# Patient Record
Sex: Male | Born: 1995 | Race: White | Hispanic: No | Marital: Single | Smoking: Never smoker
Health system: Southern US, Community
[De-identification: ages and names within clinical notes are randomized; demographics above are authoritative.]

## PROBLEM LIST (undated history)

## (undated) DIAGNOSIS — F429 Obsessive-compulsive disorder, unspecified: Secondary | ICD-10-CM

## (undated) DIAGNOSIS — Z8739 Personal history of other diseases of the musculoskeletal system and connective tissue: Secondary | ICD-10-CM

## (undated) DIAGNOSIS — E663 Overweight: Secondary | ICD-10-CM

## (undated) DIAGNOSIS — T7840XA Allergy, unspecified, initial encounter: Secondary | ICD-10-CM

## (undated) DIAGNOSIS — F419 Anxiety disorder, unspecified: Secondary | ICD-10-CM

## (undated) HISTORY — DX: Allergy, unspecified, initial encounter: T78.40XA

## (undated) HISTORY — DX: Overweight: E66.3

## (undated) HISTORY — DX: Anxiety disorder, unspecified: F41.9

## (undated) HISTORY — DX: Personal history of other diseases of the musculoskeletal system and connective tissue: Z87.39

## (undated) HISTORY — DX: Obsessive-compulsive disorder, unspecified: F42.9

---
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
## 2016-01-09 ENCOUNTER — Encounter: Payer: Self-pay | Admitting: Family Medicine

## 2016-01-09 ENCOUNTER — Ambulatory Visit (INDEPENDENT_AMBULATORY_CARE_PROVIDER_SITE_OTHER): Payer: Medicaid Other | Admitting: Family Medicine

## 2016-01-09 VITALS — BP 99/65 | HR 99 | Temp 98.5°F | Ht 70.0 in | Wt 144.8 lb

## 2016-01-09 DIAGNOSIS — Z0000 Encounter for general adult medical examination without abnormal findings: Secondary | ICD-10-CM

## 2016-01-09 DIAGNOSIS — Z79899 Other long term (current) drug therapy: Secondary | ICD-10-CM | POA: Diagnosis not present

## 2016-01-09 DIAGNOSIS — Z1159 Encounter for screening for other viral diseases: Secondary | ICD-10-CM

## 2016-01-09 LAB — CBC WITH DIFFERENTIAL/PLATELET
BASOS PCT: 0 %
Basophils Absolute: 0 cells/uL (ref 0–200)
EOS ABS: 136 {cells}/uL (ref 15–500)
EOS PCT: 2 %
HCT: 41.8 % (ref 36.0–49.0)
HEMOGLOBIN: 14.2 g/dL (ref 12.0–16.9)
LYMPHS ABS: 2516 {cells}/uL (ref 1200–5200)
Lymphocytes Relative: 37 %
MCH: 29.6 pg (ref 25.0–35.0)
MCHC: 34 g/dL (ref 31.0–36.0)
MCV: 87.3 fL (ref 78.0–98.0)
MONOS PCT: 8 %
MPV: 9.7 fL (ref 7.5–12.5)
Monocytes Absolute: 544 cells/uL (ref 200–900)
NEUTROS ABS: 3604 {cells}/uL (ref 1800–8000)
Neutrophils Relative %: 53 %
PLATELETS: 321 10*3/uL (ref 140–400)
RBC: 4.79 MIL/uL (ref 4.10–5.70)
RDW: 13.1 % (ref 11.0–15.0)
WBC: 6.8 10*3/uL (ref 4.5–13.0)

## 2016-01-09 MED ORDER — LORATADINE 10 MG PO TABS: 10.0000 mg | ORAL_TABLET | Freq: Every day | ORAL | 11 refills | Status: DC

## 2016-01-09 NOTE — Progress Notes (Signed)
Subjective: CC: annual physical exam  HPI: Patient is a 20 y.o. male with a past medical history of bipolar disorder presenting to clinic today for an annual exam.  No current issues. He has a Rx from Dr. Jannifer FranklinAkintayo who requests testing with CBC with diff, CMET, Lipid panel, vitamin D, vitamin B12, and folate levels. His mood and sleep are stable. His ADHD is doing well on Vyvanse. Appetite is good.  He walks a lot of college for exercise.   Without his brother and grandmother in the room (for which he resides) we discuss confidentiality.  Social history: He denies alcohol, drug, or tobacco use. He denies any sexual activity. He denies peer pressure or bullying. He feels safe in his home. There are no guns in the home.  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    Health Maintenance: he last saw the dentist a few months ago and is going back to have a cavity filled. He is due to HIV screening.   ROS: All other systems reviewed and are negative besides that noted in HPI.  Past Medical History Patient Active Problem List   Diagnosis Date Noted  . Healthcare maintenance 01/14/2016  . BMI (body mass index), pediatric, 5% to less than 85% for age 19/18/2015  . Seasonal allergies 09/02/2010  . 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 CONSTIPATION, CHRONIC 01/02/2010  . ACQUIRED AUDITORY PROCESSING DISORDER 11/15/2008  . OBESITY 12/14/2007  . PES PLANUS 11/16/2007  . BIPOLAR DISORDER 07/21/2006    Medications- reviewed and updated Current Outpatient Prescriptions  Medication Sig Dispense Refill  . lisdexamfetamine (VYVANSE) 60 MG capsule Take 60 mg by mouth every morning.    Marland Kitchen. buPROPion (WELLBUTRIN XL) 300 MG 24 hr tablet Take 300 mg by mouth every morning. Per Dr. Ladona Ridgelaylor    . cloNIDine (CATAPRES) 0.1 MG tablet Take 0.1 mg by mouth at bedtime. Per Dr. Ladona Ridgelaylor     . fluvoxaMINE (LUVOX) 100 MG tablet Take 200 mg by mouth at bedtime.     . folic acid (FOLVITE) 1 MG tablet TAKE 1  TABLET (1 MG TOTAL) BY MOUTH DAILY. 30 tablet 1  . loratadine (CLARITIN) 10 MG tablet Take 1 tablet (10 mg total) by mouth daily. 30 tablet 11  . Nutritional Supplements (MELATONIN PO) Unknown dose     . Psyllium (METAMUCIL) 30.9 % POWD Take by mouth as needed.      . Vitamin D, Cholecalciferol, 1000 UNITS TABS Take two tablets by mouth, daily. 60 tablet 3   No current facility-administered medications for this visit.     Objective: Office vital signs reviewed. BP 99/65   Pulse 99   Temp 98.5 F (36.9 C) (Oral)   Ht 5\' 10"  (1.778 m)   Wt 144 lb 12.8 oz (65.7 kg)   BMI 20.78 kg/m  Blood pressure percentiles are 1.5 % systolic and 21.1 % diastolic based on NHBPEP's 4th Report.     Physical Examination:  General: Awake, alert, well- nourished, NAD. Thin.  ENMT:  TMs intact, normal light reflex, no erythema, no bulging. Nasal turbinates moist. MMM, Oropharynx clear without erythema or tonsillar exudate/hypertrophy Eyes: Conjunctiva non-injected. PERRL.  Cardio: RRR, no m/r/g noted.  Pulm: No increased WOB.  CTAB, without wheezes, rhonchi or crackles noted.  GI: soft, NT/ND,+BS x4, no hepatomegaly, no splenomegaly GU: deffered Extremities: WWP, No edema, cyanosis or clubbing; +2 pulses bilaterally MSK: Normal gait and station Skin: dry, intact, no rashes or lesions Neuro: Strength and sensation grossly  intact, DTRs 2/4  Assessment/Plan: Healthcare maintenance Patient's vaccines are UTD.  Pt reportedly has never been sexually active, however will check HIV status.  Will fax results to Dr. Jannifer FranklinAkintayo at [phone].  Discussed annual eye exam, healthy food choices, and regular exercise.  Patient to f/u in 1-2 months for annual influenza vaccine then as needed after that until annual exam in 12/2016.     Orders Placed This Encounter  Procedures  . CBC with Differential/Platelet  . COMPLETE METABOLIC PANEL WITH GFR  . Lipid panel  . VITAMIN D 25 Hydroxy (Vit-D Deficiency,  Fractures)  . HIV antibody  . Vitamin B12  . Folate    Meds ordered this encounter  Medications  . lisdexamfetamine (VYVANSE) 60 MG capsule    Sig: Take 60 mg by mouth every morning.  . loratadine (CLARITIN) 10 MG tablet    Sig: Take 1 tablet (10 mg total) by mouth daily.    Dispense:  30 tablet    Refill:  11    Nadav Swindell Derek MoundS. Nastacia Raybuck PGY-3, [facility]

## 2016-01-09 NOTE — Patient Instructions (Signed)
Thank you for coming in, today!  Everything looks fine, today. I did not change any medications. Clifton Custardaron looks great and should follow up as needed.  I do recommend that he comes back to see us at least once per year for a wellness visit. I will fax all the results to Dr. Jannifer FranklinAkintayo.   Please feel free to call with any questions or concerns at any time, at [phone].

## 2016-01-10 LAB — LIPID PANEL
Cholesterol: 135 mg/dL (ref 125–170)
HDL: 46 mg/dL (ref 31–65)
LDL CALC: 78 mg/dL (ref <110)
Total CHOL/HDL Ratio: 2.9 Ratio (ref <=5.0)
Triglycerides: 57 mg/dL (ref 38–152)
VLDL: 11 mg/dL (ref <30)

## 2016-01-10 LAB — COMPLETE METABOLIC PANEL WITH GFR
ALBUMIN: 4.3 g/dL (ref 3.6–5.1)
ALT: 9 U/L (ref 8–46)
AST: 12 U/L (ref 12–32)
Alkaline Phosphatase: 87 U/L (ref 48–230)
BILIRUBIN TOTAL: 0.4 mg/dL (ref 0.2–1.1)
BUN: 10 mg/dL (ref 7–20)
CO2: 25 mmol/L (ref 20–31)
CREATININE: 1.04 mg/dL (ref 0.60–1.26)
Calcium: 9.7 mg/dL (ref 8.9–10.4)
Chloride: 103 mmol/L (ref 98–110)
GFR, Est Non African American: >89 mL/min (ref >=60)
GLUCOSE: 82 mg/dL (ref 65–99)
Potassium: 4 mmol/L (ref 3.8–5.1)
SODIUM: 141 mmol/L (ref 135–146)
TOTAL PROTEIN: 7.1 g/dL (ref 6.3–8.2)

## 2016-01-10 LAB — FOLATE: FOLATE: 18.8 ng/mL (ref >5.4)

## 2016-01-10 LAB — VITAMIN D 25 HYDROXY (VIT D DEFICIENCY, FRACTURES): VIT D 25 HYDROXY: 66 ng/mL (ref 30–100)

## 2016-01-10 LAB — VITAMIN B12

## 2016-01-10 LAB — HIV ANTIBODY (ROUTINE TESTING W REFLEX): HIV: NONREACTIVE

## 2016-01-14 DIAGNOSIS — Z0000 Encounter for general adult medical examination without abnormal findings: Secondary | ICD-10-CM | POA: Insufficient documentation

## 2016-01-14 NOTE — Assessment & Plan Note (Signed)
Patient's vaccines are UTD.  Pt reportedly has never been sexually active, however will check HIV status.  Will fax results to Dr. Jannifer FranklinAkintayo at [phone].  Discussed annual eye exam, healthy food choices, and regular exercise.  Patient to f/u in 1-2 months for annual influenza vaccine then as needed after that until annual exam in 12/2016.

## 2016-02-07 ENCOUNTER — Other Ambulatory Visit: Payer: Self-pay | Admitting: Family Medicine

## 2016-04-10 ENCOUNTER — Other Ambulatory Visit: Payer: Self-pay | Admitting: Family Medicine

## 2016-06-14 ENCOUNTER — Other Ambulatory Visit: Payer: Self-pay | Admitting: Family Medicine

## 2016-08-09 ENCOUNTER — Other Ambulatory Visit: Payer: Self-pay | Admitting: Family Medicine

## 2016-10-03 ENCOUNTER — Other Ambulatory Visit: Payer: Self-pay | Admitting: Family Medicine

## 2016-12-13 ENCOUNTER — Other Ambulatory Visit: Payer: Self-pay | Admitting: *Deleted

## 2016-12-14 MED ORDER — FOLIC ACID 1 MG PO TABS: 1.0000 mg | ORAL_TABLET | Freq: Every day | ORAL | 1 refills | Status: DC

## 2017-01-07 ENCOUNTER — Encounter: Payer: Self-pay | Admitting: Family Medicine

## 2017-01-07 ENCOUNTER — Ambulatory Visit (INDEPENDENT_AMBULATORY_CARE_PROVIDER_SITE_OTHER): Payer: Medicaid Other | Admitting: Family Medicine

## 2017-01-07 VITALS — BP 108/64 | HR 103 | Temp 98.4°F | Ht 70.5 in | Wt 141.0 lb

## 2017-01-07 DIAGNOSIS — Z0000 Encounter for general adult medical examination without abnormal findings: Secondary | ICD-10-CM

## 2017-01-07 DIAGNOSIS — Z23 Encounter for immunization: Secondary | ICD-10-CM | POA: Diagnosis not present

## 2017-01-07 NOTE — Patient Instructions (Signed)
Today we talked about health maintenance topics. You received your tetanus vaccine today in clinic. We will draw a cbc, cmp, and b12 today. WE will call you with these results.

## 2017-01-08 LAB — CMP AND LIVER
ALBUMIN: 4.9 g/dL (ref 3.5–5.5)
ALT: 15 IU/L (ref 0–44)
AST: 19 IU/L (ref 0–40)
Alkaline Phosphatase: 99 IU/L (ref 39–117)
BUN: 13 mg/dL (ref 6–20)
Bilirubin Total: 0.3 mg/dL (ref 0.0–1.2)
Bilirubin, Direct: 0.09 mg/dL (ref 0.00–0.40)
CALCIUM: 10.3 mg/dL — AB (ref 8.7–10.2)
CHLORIDE: 99 mmol/L (ref 96–106)
CO2: 26 mmol/L (ref 20–29)
CREATININE: 1.06 mg/dL (ref 0.76–1.27)
GFR, EST AFRICAN AMERICAN: 117 mL/min/{1.73_m2} (ref >59)
GFR, EST NON AFRICAN AMERICAN: 101 mL/min/{1.73_m2} (ref >59)
Glucose: 93 mg/dL (ref 65–99)
Potassium: 4.6 mmol/L (ref 3.5–5.2)
SODIUM: 142 mmol/L (ref 134–144)
TOTAL PROTEIN: 7.5 g/dL (ref 6.0–8.5)

## 2017-01-08 LAB — CBC WITH DIFFERENTIAL/PLATELET
Basophils Absolute: 0 10*3/uL (ref 0.0–0.2)
Basos: 0 %
EOS (ABSOLUTE): 0.1 10*3/uL (ref 0.0–0.4)
EOS: 1 %
Hematocrit: 43.7 % (ref 37.5–51.0)
Hemoglobin: 15 g/dL (ref 13.0–17.7)
IMMATURE GRANS (ABS): 0 10*3/uL (ref 0.0–0.1)
IMMATURE GRANULOCYTES: 0 %
LYMPHS: 25 %
Lymphocytes Absolute: 2 10*3/uL (ref 0.7–3.1)
MCH: 30.6 pg (ref 26.6–33.0)
MCHC: 34.3 g/dL (ref 31.5–35.7)
MCV: 89 fL (ref 79–97)
MONOS ABS: 0.7 10*3/uL (ref 0.1–0.9)
Monocytes: 9 %
NEUTROS PCT: 65 %
Neutrophils Absolute: 5.2 10*3/uL (ref 1.4–7.0)
PLATELETS: 339 10*3/uL (ref 150–379)
RBC: 4.9 x10E6/uL (ref 4.14–5.80)
RDW: 13.2 % (ref 12.3–15.4)
WBC: 8 10*3/uL (ref 3.4–10.8)

## 2017-01-08 LAB — VITAMIN B12: Vitamin B-12: >2000 pg/mL — ABNORMAL HIGH (ref 232–1245)

## 2017-01-17 NOTE — Assessment & Plan Note (Signed)
Patient received TDaP today as this was due Discussed healthy food choice, regular exercise, and encouraged yearly eye exam and dental checkup twice per year Will check cbc, cmp, and b12 due to psychiatric medications Patient to follow up in 1 year for annual physical

## 2017-01-17 NOTE — Progress Notes (Signed)
Subjective:     Justin Barber is a 21 y.o. male and is here for a comprehensive physical exam. The patient reports no problems. He is doing very well with no social, physical, or mental complaints. He says that he dos not get much exercise, with that being limited to occasional walking probably 1-2 times per week. He tries to eat a well balanced diet often getting 1-2 servings of vegetables per day. He occasionally does eat what could be considered junk food and does consume some sugary drinks. He is currently looking for a job. He enjoys reading, movies, and watching tv. He is not sexually active, and has never consumed alcohol, tobacco, or other drugs.  Social History   Social History  . Marital status: Single    Spouse name: N/A  . Number of children: N/A  . Years of education: N/A   Occupational History  . Not on file.   Social History Main Topics  . Smoking status: Never Smoker  . Smokeless tobacco: Never Used  . Alcohol use No  . Drug use: No  . Sexual activity: No   Other Topics Concern  . Not on file   Social History Narrative  . No narrative on file   Health Maintenance  Topic Date Due  . INFLUENZA VACCINE  12/22/2016  . TETANUS/TDAP  01/08/2027  . HIV Screening  Completed    The following portions of the patient's history were reviewed and updated as appropriate: allergies, current medications, past family history, past medical history, past social history, past surgical history and problem list.  Review of Systems Review of Systems  Constitutional: Negative for chills and fever.  HENT: Negative for hearing loss and tinnitus.   Eyes: Negative for pain, discharge and redness.  Respiratory: Negative for cough.   Cardiovascular: Negative for chest pain, palpitations, orthopnea and claudication.  Gastrointestinal: Negative for abdominal pain, constipation, diarrhea, nausea and vomiting.  Genitourinary: Negative for dysuria, flank pain, hematuria and urgency.   Musculoskeletal: Negative for myalgias.  Neurological: Negative for dizziness, weakness and headaches.  Psychiatric/Behavioral: Negative for suicidal ideas.     Objective:    General appearance: alert, cooperative and appears stated age Head: Normocephalic, without obvious abnormality, atraumatic Eyes: conjunctivae/corneas clear. PERRL, EOM's intact. Fundi benign. Ears: normal TM's and external ear canals both ears Nose: Nares normal. Septum midline. Mucosa normal. No drainage or sinus tenderness. Throat: lips, mucosa, and tongue normal; teeth and gums normal Neck: no adenopathy, no carotid bruit, no JVD, supple, symmetrical, trachea midline and thyroid not enlarged, symmetric, no tenderness/mass/nodules Lungs: clear to auscultation bilaterally and normal percussion bilaterally Chest wall: no tenderness Heart: regular rate and rhythm, S1, S2 normal, no murmur, click, rub or gallop and normal apical impulse Abdomen: soft, non-tender; bowel sounds normal; no masses,  no organomegaly Extremities: extremities normal, atraumatic, no cyanosis or edema Pulses: 2+ and symmetric Skin: Skin color, texture, turgor normal. No rashes or lesions Neurologic: Alert and oriented X 3, normal strength and tone. Normal symmetric reflexes. Normal coordination and gait AOx3, cn 2-12 intact   Assessment and plan  Healthcare Maintenence Patient received TDaP today as this was due Discussed healthy food choice, regular exercise, and encouraged yearly eye exam and dental checkup twice per year Will check cbc, cmp, and b12 due to psychiatric medications Patient to follow up in 1 year for annual physical     Myrene Buddy MD PGY-1 Family Medicine Resident

## 2017-02-06 ENCOUNTER — Other Ambulatory Visit: Payer: Self-pay | Admitting: Family Medicine

## 2017-05-13 ENCOUNTER — Other Ambulatory Visit: Payer: Self-pay | Admitting: *Deleted

## 2017-05-13 MED ORDER — FOLIC ACID 1 MG PO TABS: 1.0000 mg | ORAL_TABLET | Freq: Every day | ORAL | 1 refills | Status: DC

## 2017-05-13 NOTE — Telephone Encounter (Signed)
Patient's grandmother left message on nurse line requesting refill on folic acid. States she has called repeatedly about this and [facility] has been sending faxed requests as well. Please call grandmother at [phone] when this has been refilled. Kinnie FeilL. Riddhi Grether, RN, BSN

## 2017-05-13 NOTE — Telephone Encounter (Signed)
[facility] [place]. Please call patient grandmother and let her know this has been sent in.  Myrene BuddyJacob Damichael Hofman MD PGY-1 Family Medicine Resident

## 2017-06-02 NOTE — Telephone Encounter (Signed)
Attempted to call pt and grandmother, no answer or voicemail set up. Deseree Bruna PotterBlount, CMA

## 2017-07-11 ENCOUNTER — Encounter: Payer: Self-pay | Admitting: Student

## 2017-07-11 ENCOUNTER — Ambulatory Visit (INDEPENDENT_AMBULATORY_CARE_PROVIDER_SITE_OTHER): Payer: Medicaid Other | Admitting: Student

## 2017-07-11 ENCOUNTER — Other Ambulatory Visit: Payer: Self-pay

## 2017-07-11 VITALS — BP 108/60 | HR 111 | Temp 98.3°F | Ht 70.5 in | Wt 154.0 lb

## 2017-07-11 DIAGNOSIS — R05 Cough: Secondary | ICD-10-CM | POA: Diagnosis present

## 2017-07-11 DIAGNOSIS — R000 Tachycardia, unspecified: Secondary | ICD-10-CM | POA: Diagnosis not present

## 2017-07-11 DIAGNOSIS — R059 Cough, unspecified: Secondary | ICD-10-CM

## 2017-07-11 NOTE — Progress Notes (Signed)
  Subjective:    Justin Barber is a 22 y.o. old male here for cough.  He is here with his grandfather  HPI Cough: this has been going on for two weeks. Cold symptoms two weeks ago. Cough is productive with yellowish phlegm. Denies hemoptysis. Had fever when he had a cold but not any more. Denies shortness of breath, chest pain, history seasonal allergy, asthma, GERD. Denies itchy eyes or nose. Cough is about the same. Cough is mainly at night. Denies GI symptoms.  Had his flu vaccine this year. Tried mucinex and another cough medicine without significant improvement.   PMH/Problem List: has OBESITY; Bipolar I disorder (HCC); ACQUIRED AUDITORY PROCESSING DISORDER; CONSTIPATION, CHRONIC; ACNE VULGARIS; PES PLANUS; Seasonal allergies; BMI (body mass index), pediatric, 5% to less than 85% for age; and Healthcare maintenance on their problem list.   has a past medical history of Allergy, Anxiety, OCD (obsessive compulsive disorder), and Over weight.  FH:  Family History  Problem Relation Age of Onset  . Depression Mother   . Diabetes Mother     [facility] Social History   Tobacco Use  . Smoking status: Never Smoker  . Smokeless tobacco: Never Used  Substance Use Topics  . Alcohol use: No  . Drug use: No    Review of Systems Review of systems negative except for pertinent positives and negatives in history of present illness above.     Objective:     Vitals:   07/11/17 1123  BP: 108/60  Pulse: (!) 111  Temp: 98.3 F (36.8 C)  TempSrc: Oral  SpO2: 98%  Weight: 154 lb (69.9 kg)  Height: 5' 10.5" (1.791 m)   Body mass index is 21.78 kg/m.  Physical Exam  GEN: appears well, no apparent distress. Eyes: conjunctiva without injection, sclera anicteric Nares: no rhinorrhea, congestion or erythema Oropharynx: mmm without erythema or exudation, no petechiae, uvula midline HEM: negative for cervical or periauricular lymphadenopathies CVS: Tachycardic to 102 on my count, RR, nl s1 & s2, no  murmurs, no edema RESP: no IWOB, good air movement bilaterally, CTAB GI: BS present & normal, soft, NTND MSK: no focal tenderness or notable swelling SKIN: no apparent skin lesion ENDO: negative thyromegally NEURO: alert and oiented appropriately, no gross deficits     Assessment and Plan:  1. Cough: likely residual from his viral URI.  Denies allergies or GERD.  No history of asthma.  No constitutional symptoms. -Discussed conservative management including a tablespoonful of honey before bedtime and adequate hydration. -Discussed return precautions. -Patient and grandfather understand that cough might linger around for up to 5 weeks.   2. Tachycardia: Initially tachycardic to 111.  Heart rate down to 102 on my count.  He is asymptomatic.  Denies palpitation.  He has history of ADHD and he is on Vyvanse which could be contributing to his tachycardia.  He had a normal TSH about 3 years ago.  Does not look anemic.  Return if symptoms worsen or fail to improve.  Almon Herculesaye T Gonfa, MD 07/11/17 Pager: [phone]

## 2017-07-11 NOTE — Patient Instructions (Signed)
It appears that you have a viral upper respiratory infection (Common Cold).  Cold symptoms typically peak at 3-4 days of illness and then gradually improve over 10-14 days. However, a cough may last 3-5 weeks.   - A tablespoonful of honey before bedtime is helpful for cough - Get plenty of rest and adequate hydration. - Consume warm fluids (soup or tea). It relieves stuffy nose, and to loosen phlegm. - Can try saline nasal spray or a Neti Pot for stuffy nose  CONTACT YOUR DOCTOR IF YOU EXPERIENCE ANY OF THE FOLLOWING: - High fever, chest pain, shortness of breath or  not able to keep down food or fluids.  - Cough that gets worse while other cold symptoms improve - Flare up of any chronic lung problem, such as asthma - Your symptoms persist longer than 2 weeks  

## 2017-07-20 ENCOUNTER — Other Ambulatory Visit: Payer: Self-pay | Admitting: Family Medicine

## 2017-09-18 ENCOUNTER — Other Ambulatory Visit: Payer: Self-pay | Admitting: Family Medicine

## 2017-09-27 ENCOUNTER — Ambulatory Visit (INDEPENDENT_AMBULATORY_CARE_PROVIDER_SITE_OTHER): Payer: Medicaid Other

## 2017-09-27 DIAGNOSIS — Z111 Encounter for screening for respiratory tuberculosis: Secondary | ICD-10-CM

## 2017-09-27 NOTE — Progress Notes (Signed)
Tuberculin skin test applied to left ventral forearm.  Patient informed to schedule appt for nurse visit in 48-72 hours to have site read.  Meredith B Thomsen, RN   

## 2017-09-30 ENCOUNTER — Ambulatory Visit: Payer: Medicaid Other

## 2017-09-30 DIAGNOSIS — Z111 Encounter for screening for respiratory tuberculosis: Secondary | ICD-10-CM

## 2017-09-30 LAB — TB SKIN TEST: TB Skin Test: NEGATIVE

## 2017-09-30 NOTE — Progress Notes (Signed)
PPD Reading Note PPD read and results entered in Epic. Result: 0 mm induration. Interpretation: Negative Elizette Shek B Ethylene Reznick, RN   

## 2017-11-19 ENCOUNTER — Other Ambulatory Visit: Payer: Self-pay | Admitting: Family Medicine

## 2018-01-17 ENCOUNTER — Other Ambulatory Visit: Payer: Self-pay | Admitting: Family Medicine

## 2018-01-20 ENCOUNTER — Ambulatory Visit (INDEPENDENT_AMBULATORY_CARE_PROVIDER_SITE_OTHER): Payer: Medicaid Other | Admitting: Family Medicine

## 2018-01-20 ENCOUNTER — Other Ambulatory Visit: Payer: Self-pay

## 2018-01-20 ENCOUNTER — Encounter: Payer: Self-pay | Admitting: Family Medicine

## 2018-01-20 VITALS — BP 98/62 | HR 114 | Temp 98.6°F | Ht 70.5 in | Wt 148.0 lb

## 2018-01-20 DIAGNOSIS — Z0000 Encounter for general adult medical examination without abnormal findings: Secondary | ICD-10-CM

## 2018-01-20 DIAGNOSIS — Z23 Encounter for immunization: Secondary | ICD-10-CM

## 2018-01-20 MED ORDER — FOLIC ACID 1 MG PO TABS: 1.0000 mg | ORAL_TABLET | Freq: Every day | ORAL | 3 refills | Status: DC

## 2018-01-20 NOTE — Patient Instructions (Signed)
I am glad that things have been going very well this year.  I am sorry to hear that you have to have retinal surgery soon, I hope that it goes well please let me know if there is any way I can be of assistance.  I gave you a year supply of your folic acid.  We will be drawing some labs today to make sure that your gastric medications are not causing metabolic problem.

## 2018-01-21 LAB — COMPREHENSIVE METABOLIC PANEL
A/G RATIO: 1.7 (ref 1.2–2.2)
ALT: 13 IU/L (ref 0–44)
AST: 17 IU/L (ref 0–40)
Albumin: 4.6 g/dL (ref 3.5–5.5)
Alkaline Phosphatase: 100 IU/L (ref 39–117)
BUN/Creatinine Ratio: 9 (ref 9–20)
BUN: 11 mg/dL (ref 6–20)
Bilirubin Total: 0.2 mg/dL (ref 0.0–1.2)
CALCIUM: 10.3 mg/dL — AB (ref 8.7–10.2)
CHLORIDE: 101 mmol/L (ref 96–106)
CO2: 28 mmol/L (ref 20–29)
CREATININE: 1.16 mg/dL (ref 0.76–1.27)
GFR, EST AFRICAN AMERICAN: 104 mL/min/{1.73_m2} (ref >59)
GFR, EST NON AFRICAN AMERICAN: 90 mL/min/{1.73_m2} (ref >59)
Globulin, Total: 2.7 g/dL (ref 1.5–4.5)
Glucose: 93 mg/dL (ref 65–99)
POTASSIUM: 4.2 mmol/L (ref 3.5–5.2)
Sodium: 144 mmol/L (ref 134–144)
TOTAL PROTEIN: 7.3 g/dL (ref 6.0–8.5)

## 2018-01-21 LAB — CBC WITH DIFFERENTIAL/PLATELET
BASOS: 0 %
Basophils Absolute: 0 10*3/uL (ref 0.0–0.2)
EOS (ABSOLUTE): 0 10*3/uL (ref 0.0–0.4)
EOS: 1 %
Hematocrit: 44.8 % (ref 37.5–51.0)
Hemoglobin: 15.4 g/dL (ref 13.0–17.7)
IMMATURE GRANS (ABS): 0 10*3/uL (ref 0.0–0.1)
IMMATURE GRANULOCYTES: 0 %
LYMPHS: 23 %
Lymphocytes Absolute: 1.3 10*3/uL (ref 0.7–3.1)
MCH: 30.2 pg (ref 26.6–33.0)
MCHC: 34.4 g/dL (ref 31.5–35.7)
MCV: 88 fL (ref 79–97)
Monocytes Absolute: 0.8 10*3/uL (ref 0.1–0.9)
Monocytes: 14 %
NEUTROS PCT: 62 %
Neutrophils Absolute: 3.4 10*3/uL (ref 1.4–7.0)
Platelets: 291 10*3/uL (ref 150–450)
RBC: 5.1 x10E6/uL (ref 4.14–5.80)
RDW: 11.8 % — ABNORMAL LOW (ref 12.3–15.4)
WBC: 5.5 10*3/uL (ref 3.4–10.8)

## 2018-01-21 LAB — VITAMIN B12: Vitamin B-12: 641 pg/mL (ref 232–1245)

## 2018-01-24 ENCOUNTER — Encounter: Payer: Self-pay | Admitting: Family Medicine

## 2018-01-24 NOTE — Progress Notes (Signed)
   HPI 22 year old who presents for annual wellness visit.  Had no issues over the last year.  Only seen once in clinic for cough.  See psychiatry for bipolar 1 and adhd.  Does not have any medication adjustments this year tolerating clonidine and Wellbutrin well with no side effects.  Also tolerating vyvanse well with no side effects.  CC: annual wellness   ROS:   Review of Systems See HPI for ROS.   CC, SH/smoking status, and VS noted  Objective: BP 98/62   Pulse (!) 114   Temp 98.6 F (37 C) (Oral)   Ht 5' 10.5" (1.791 m)   Wt 148 lb (67.1 kg)   SpO2 99%   BMI 20.94 kg/m  General appearance: alert, cooperative and appears stated age Head: Normocephalic, without obvious abnormality, atraumatic Eyes: conjunctivae/corneas clear. PERRL, EOM's intact. Fundi benign. Ears: normal TM's and external ear canals both ears Nose: Nares normal. Septum midline. Mucosa normal. No drainage or sinus tenderness. Throat: lips, mucosa, and tongue normal; teeth and gums normal Neck: no adenopathy, no carotid bruit, no JVD, supple, symmetrical, trachea midline and thyroid not enlarged, symmetric, no tenderness/mass/nodules Lungs: CTAB, normal work of breathing Heart: rrr, S1, S2 normal, no m/r/g and normal apical impulse Abdomen: soft, non-tender; bowel sounds normal; no masses,  no organomegaly Extremities: extremities normal, atraumatic, no cyanosis or edema Pulses: 2+ and symmetric Skin: Skin color, texture, turgor normal. No rashes or lesions Neurologic: Alert and oriented X 3, normal strength and tone. Normal symmetric reflexes. Normal coordination and gait AOx3, cn 2-12 intact   Assessment and plan:  Healthcare maintenance Drew CMP, CBC, Vitamin B12 for screening of abnormal metabolic side effects from psych meds. Also received flu vaccine/   Orders Placed This Encounter  Procedures  . Flu Vaccine QUAD 36+ mos IM  . CBC with Differential  . Comprehensive metabolic panel    Order  Specific Question:   Has the patient fasted?    Answer:   No  . Vitamin B12    Meds ordered this encounter  Medications  . folic acid (FOLVITE) 1 MG tablet    Sig: Take 1 tablet (1 mg total) by mouth daily.    Dispense:  90 tablet    Refill:  3     Myrene Buddy MD PGY-2 Family Medicine Resident  01/24/2018 8:43 AM

## 2018-01-24 NOTE — Assessment & Plan Note (Signed)
Drew CMP, CBC, Vitamin B12 for screening of abnormal metabolic side effects from psych meds. Also received flu vaccine/

## 2018-02-02 NOTE — Progress Notes (Signed)
[facility] Note  02/03/2018     CHIEF COMPLAINT Patient presents for Retina Evaluation   HISTORY OF PRESENT ILLNESS: Justin Barber is a 22 y.o. male who presents to the clinic today for:   HPI    Retina Evaluation    In both eyes.  Associated Symptoms Floaters and Shoulder/Hip pain.  Negative for Flashes, Pain, Photophobia, Distortion, Blind Spot, Glare, Redness, Jaw Claudication, Scalp Tenderness, Trauma, Fever, Weight Loss and Fatigue.  Context:  distance vision, mid-range vision and near vision.  Treatments tried include no treatments.  I, the attending physician,  performed the HPI with the patient and updated documentation appropriately.          Comments    Referral for Dr. Rexene Edison for retina eval.Patient states he went for a routine eye yearly exam and was told he had holes OU, Pt states for appx 6 months he has noticed occasional  floaters ou denies flashes, photo sensitivity and ocular pain. Denies gtt's/vit's       Last edited by Bernarda Caffey, MD on 02/03/2018  3:55 PM. (History)    Pt states he sees Gwenlyn Perking at [facility] for routine care; Pt states he has floaters occasionally OU; Pt denies seeing flashes; Pt states he is pleased with OU VA;   Referring physician: Shirleen Schirmer, PA-C [phone], [place] [address] [phone]  HISTORICAL INFORMATION:   Selected notes from the MEDICAL RECORD NUMBER Referred by Shirleen Schirmer, PA-C for concern of retinal hole OU LEE: 08.28.19 (A. Lundquist) [BCVA: OD: 20/20 OS: 20/20-2 Ocular Hx-retinal hole OU, lattice degeneration OU PMH-anxiety, bipolar, OCD, ADHD    CURRENT MEDICATIONS: Current Outpatient Medications (Ophthalmic Drugs)  Medication Sig  . prednisoLONE acetate (PRED FORTE) 1 % ophthalmic suspension Place 1 drop into the right eye 4 (four) times daily for 7 days.   No current facility-administered medications for this visit.  (Ophthalmic Drugs)   Current  Outpatient Medications (Other)  Medication Sig  . buPROPion (WELLBUTRIN XL) 300 MG 24 hr tablet Take 300 mg by mouth every morning. Per Dr. Lovena Le  . cloNIDine (CATAPRES) 0.1 MG tablet Take 0.1 mg by mouth at bedtime. Per Dr. Lovena Le   . fluvoxaMINE (LUVOX) 100 MG tablet Take 200 mg by mouth at bedtime.   . folic acid (FOLVITE) 1 MG tablet Take 1 tablet (1 mg total) by mouth daily.  Marland Kitchen lisdexamfetamine (VYVANSE) 60 MG capsule Take 60 mg by mouth every morning.  . loratadine (CLARITIN) 10 MG tablet Take 1 tablet (10 mg total) by mouth daily.  . Nutritional Supplements (MELATONIN PO) Unknown dose   . Psyllium (METAMUCIL) 30.9 % POWD Take by mouth as needed.    . Vitamin D, Cholecalciferol, 1000 UNITS TABS Take two tablets by mouth, daily.   No current facility-administered medications for this visit.  (Other)      REVIEW OF SYSTEMS: ROS    Positive for: Eyes   Negative for: Constitutional, Gastrointestinal, Neurological, Skin, Genitourinary, Musculoskeletal, HENT, Endocrine, Cardiovascular, Respiratory, Psychiatric, Allergic/Imm, Heme/Lymph   Last edited by Zenovia Jordan, LPN on 1/54/0086  7:61 PM. (History)       ALLERGIES No Known Allergies  PAST MEDICAL HISTORY Past Medical History:  Diagnosis Date  . Allergy   . Anxiety   . OCD (obsessive compulsive disorder)   . Over weight    History reviewed. No pertinent surgical history.  FAMILY HISTORY Family History  Problem Relation Age of Onset  .  Depression Mother   . Diabetes Mother     SOCIAL HISTORY Social History   Tobacco Use  . Smoking status: Never Smoker  . Smokeless tobacco: Never Used  Substance Use Topics  . Alcohol use: No  . Drug use: No         OPHTHALMIC EXAM:  Base Eye Exam    Visual Acuity (Snellen - Linear)      Right Left   Dist cc 20/30 20/30 -1   Dist ph cc 20/20 -1 20/20       Tonometry (Tonopen, 2:02 PM)      Right Left   Pressure 15 15       Pupils      Dark Light Shape  React APD   Right 5 4 Round Brisk None   Left 5 4 Round Brisk None       Visual Fields      Left Right    Full Full       Extraocular Movement      Right Left    Full, Ortho Full, Ortho       Neuro/Psych    Oriented x3:  Yes   Mood/Affect:  Normal       Dilation    Both eyes:  1.0% Mydriacyl, 2.5% Phenylephrine @ 2:01 PM        Slit Lamp and Fundus Exam    Slit Lamp Exam      Right Left   Lids/Lashes UL Telangiectasia, Meibomian gland dysfunction UL Telangiectasia, Meibomian gland dysfunction   Conjunctiva/Sclera White and quiet White and quiet   Cornea Clear Clear   Anterior Chamber Deep and quiet Deep and quiet   Iris Round and dilated Round and dilated   Lens Clear Clear   Vitreous Normal Normal       Fundus Exam      Right Left   Disc Tilted disc, Temporal Peripapillary atrophy Tilted disc, Peripapillary atrophy and pigmentaion   C/D Ratio 0.3 0.1   Macula Good foveal reflex Good foveal reflex   Vessels Normal Normal   Periphery Attached; temporal lattice degeneration with atrophic holes at 0700, 0800, 1000, 1100; no SRF Lattice degeneration at 0130, pigmented Chorioretinal scar at 0400, pigmented inferior lattice, superior VR tufts at 1200, otherwise attached        Refraction    Wearing Rx      Sphere Cylinder Axis   Right -5.00 Sphere    Left -5.75 +0.25 104       Manifest Refraction      Sphere Cylinder Axis Dist VA   Right -4.75 Sphere  20/30   Left -6.25 +0.25 104 20/25          IMAGING AND PROCEDURES  Imaging and Procedures for @TODAY @  OCT, Retina - OU - Both Eyes       Right Eye Quality was good. Central Foveal Thickness: 289. Progression has no prior data. Findings include normal foveal contour, no IRF, no SRF.   Left Eye Quality was good. Central Foveal Thickness: 287. Progression has no prior data. Findings include normal foveal contour, no IRF, no SRF.   Notes *Images captured and stored on drive  Diagnosis / Impression:   NFP, No IRF/SRF  Clinical management:  See below  Abbreviations: NFP - Normal foveal profile. CME - cystoid macular edema. PED - pigment epithelial detachment. IRF - intraretinal fluid. SRF - subretinal fluid. EZ - ellipsoid zone. ERM - epiretinal membrane. ORA - outer retinal atrophy. ORT -  outer retinal tubulation. SRHM - subretinal hyper-reflective material         Repair Retinal Breaks, Laser - OD - Right Eye       LASER PROCEDURE NOTE  Procedure:  Barrier laser retinopexy using slit lamp laser, RIGHT eye   Diagnosis:   Lattice degeneration w/ retinal holes, RIGHT eye                     Multiple retinal holes in peripheral temporal hemisphere anterior to equator   Surgeon: Bernarda Caffey, MD, PhD  Anesthesia: Topical  Informed consent obtained, operative eye marked, and time out performed prior to initiation of laser.   Laser settings:  Lumenis [id number] laser, slit lamp Lens: [place]ster PRP 165 Power: 270 mW Spot size: 200 microns Duration: 30 msec  # spots: 401  Placement of laser: Using a Mainster PRP 165 contact lens at the slit lamp, laser was placed in three confluent rows around patches of lattice degeneration with retinal holes in temporal periphery. Additional anterior rows placed using laser indirect ophthalmoscope -- 415 spots, power 290 mW, 50 ms duration.  Complications: None.  Patient tolerated the procedure well and received written and verbal post-procedure care information/education.                  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 Retinal edema H35.81 OCT, Retina - OU - Both Eyes  4. Myopia of both eyes H52.13     1,2. Lattice degeneration w/ atrophic holes, OU - patches of lattice w/ atrophic holes temporal periphery OU -- no SRF or RD - discussed  findings, prognosis, and treatment options including observation - recommend laser retinopexy OU, OD first - pt wishes to proceed with laser - RBA of procedure discussed, questions answered - informed consent obtained and signed - see procedure note - start PF QID OD x7 days - f/u in 2 wks, sooner prn  3. No retinal edema on exam or OCT  4. High myopia OU - under the expert care of Irion - discussed association of myopia with lattice degeneration and retinal breaks   Ophthalmic Meds Ordered this visit:  Meds ordered this encounter  Medications  . prednisoLONE acetate (PRED FORTE) 1 % ophthalmic suspension    Sig: Place 1 drop into the right eye 4 (four) times daily for 7 days.    Dispense:  10 mL    Refill:  0       Return in about 2 weeks (around 02/17/2018) for F/U laser ret OD, DFE.  There are no Patient Instructions on file for this visit.   Explained the diagnoses, plan, and follow up with the patient and they expressed understanding.  Patient expressed understanding of the importance of proper follow up care.   This document serves as a record of services personally performed by Gardiner Sleeper, MD, PhD. It was created on their behalf by Ernest Mallick, OA, an ophthalmic assistant. The creation of this record is the provider's dictation and/or activities during the visit.    Electronically signed by: Ernest Mallick, OA  09.12.2019 1:59 AM   This document serves as a record of services personally performed by Gardiner Sleeper, MD, PhD. It was created on their behalf by Catha Brow, COA, a certified ophthalmic  assistant. The creation of this record is the provider's dictation and/or activities during the visit.  Electronically signed by: Catha Brow, COA  09.13.19 1:59 AM   Gardiner Sleeper, M.D., Ph.D. Diseases & Surgery of the Retina and Vitreous [facility]  I have reviewed the above documentation for accuracy and completeness,  and I agree with the above. Gardiner Sleeper, M.D., Ph.D. 02/04/18 2:03 AM      Abbreviations: M myopia (nearsighted); A astigmatism; H hyperopia (farsighted); P presbyopia; Mrx spectacle prescription;  CTL contact lenses; OD right eye; OS left eye; OU both eyes  XT exotropia; ET esotropia; PEK punctate epithelial keratitis; PEE punctate epithelial erosions; DES dry eye syndrome; MGD meibomian gland dysfunction; ATs artificial tears; PFAT's preservative free artificial tears; [place] nuclear sclerotic cataract; PSC posterior subcapsular cataract; ERM epi-retinal membrane; PVD posterior vitreous detachment; RD retinal detachment; DM diabetes mellitus; DR diabetic retinopathy; NPDR non-proliferative diabetic retinopathy; PDR proliferative diabetic retinopathy; CSME clinically significant macular edema; DME diabetic macular edema; dbh dot blot hemorrhages; CWS cotton wool spot; POAG primary open angle glaucoma; C/D cup-to-disc ratio; HVF humphrey visual field; GVF goldmann visual field; OCT optical coherence tomography; IOP intraocular pressure; BRVO Branch retinal vein occlusion; CRVO central retinal vein occlusion; CRAO central retinal artery occlusion; BRAO branch retinal artery occlusion; RT retinal tear; SB scleral buckle; PPV pars plana vitrectomy; VH Vitreous hemorrhage; PRP panretinal laser photocoagulation; IVK intravitreal kenalog; VMT vitreomacular traction; MH Macular hole;  NVD neovascularization of the disc; NVE neovascularization elsewhere; AREDS age related eye disease study; ARMD age related macular degeneration; POAG primary open angle glaucoma; EBMD epithelial/anterior basement membrane dystrophy; ACIOL anterior chamber intraocular lens; IOL intraocular lens; PCIOL posterior chamber intraocular lens; Phaco/IOL phacoemulsification with intraocular lens placement; [place] photorefractive keratectomy; LASIK laser assisted in situ keratomileusis; HTN hypertension; DM diabetes mellitus; COPD chronic  obstructive pulmonary disease

## 2018-02-03 ENCOUNTER — Encounter (INDEPENDENT_AMBULATORY_CARE_PROVIDER_SITE_OTHER): Payer: Self-pay | Admitting: Ophthalmology

## 2018-02-03 ENCOUNTER — Ambulatory Visit (INDEPENDENT_AMBULATORY_CARE_PROVIDER_SITE_OTHER): Payer: Medicaid Other | Admitting: Ophthalmology

## 2018-02-03 DIAGNOSIS — H35413 Lattice degeneration of retina, bilateral: Secondary | ICD-10-CM

## 2018-02-03 DIAGNOSIS — H3581 Retinal edema: Secondary | ICD-10-CM | POA: Diagnosis not present

## 2018-02-03 DIAGNOSIS — H5213 Myopia, bilateral: Secondary | ICD-10-CM

## 2018-02-03 DIAGNOSIS — H33323 Round hole, bilateral: Secondary | ICD-10-CM

## 2018-02-03 MED ORDER — PREDNISOLONE ACETATE 1 % OP SUSP: 1.0000 [drp] | Freq: Four times a day (QID) | OPHTHALMIC | 0 refills | Status: AC

## 2018-02-04 ENCOUNTER — Encounter (INDEPENDENT_AMBULATORY_CARE_PROVIDER_SITE_OTHER): Payer: Self-pay | Admitting: Ophthalmology

## 2018-02-16 NOTE — Progress Notes (Addendum)
[facility] - Clinic Note  02/17/2018     CHIEF COMPLAINT Patient presents for Retina Follow Up   HISTORY OF PRESENT ILLNESS: Justin Barber is a 22 y.o. male who presents to the clinic today for:   HPI    Retina Follow Up    Patient presents with  Other.  In both eyes.  Severity is moderate.  Duration of 2 weeks.  Since onset it is stable.  I, the attending physician,  performed the HPI with the patient and updated documentation appropriately.          Comments    Pt presents for lattice degeneration OU f/u (s/p laser retinopexy OD), pt states VA seems the same as last time, although it is hard for him to tell, pt states he tolerated laser well, he had some pain afterwards, but not enough to take meds, pt states he is not having any flashes or floaters currently, pt used gtts as directed and then dc after 7 days       Last edited by Rennis Chris, MD on 02/17/2018  3:25 PM. (History)    Pt states he sees Mathis Fare at [facility] for routine care; Pt states he has floaters occasionally OU; Pt denies seeing flashes; Pt states he is pleased with OU VA;   Referring physician: Alma Downs, PA-C [phone] [address] [place], [place] [address] [phone]  HISTORICAL INFORMATION:   Selected notes from the MEDICAL RECORD NUMBER Referred by Alma Downs, PA-C for concern of retinal hole OU LEE: 08.28.19 (A. Lundquist) [BCVA: OD: 20/20 OS: 20/20-2 Ocular Hx-retinal hole OU, lattice degeneration OU PMH-anxiety, bipolar, OCD, ADHD    CURRENT MEDICATIONS: No current outpatient medications on file. (Ophthalmic Drugs)   No current facility-administered medications for this visit.  (Ophthalmic Drugs)   Current Outpatient Medications (Other)  Medication Sig  . buPROPion (WELLBUTRIN XL) 300 MG 24 hr tablet Take 300 mg by mouth every morning. Per Dr. Ladona Ridgel  . cloNIDine (CATAPRES) 0.1 MG tablet Take 0.1 mg by mouth at bedtime. Per Dr. Ladona Ridgel   . fluvoxaMINE  (LUVOX) 100 MG tablet Take 200 mg by mouth at bedtime.   . folic acid (FOLVITE) 1 MG tablet Take 1 tablet (1 mg total) by mouth daily.  Marland Kitchen lisdexamfetamine (VYVANSE) 60 MG capsule Take 60 mg by mouth every morning.  . loratadine (CLARITIN) 10 MG tablet Take 1 tablet (10 mg total) by mouth daily.  . Nutritional Supplements (MELATONIN PO) Unknown dose   . Psyllium (METAMUCIL) 30.9 % POWD Take by mouth as needed.    . Vitamin D, Cholecalciferol, 1000 UNITS TABS Take two tablets by mouth, daily.   No current facility-administered medications for this visit.  (Other)   Pt states he had no problems with laser, he used gtts for 7 days as directed   REVIEW OF SYSTEMS: ROS    Positive for: Eyes, Psychiatric   Negative for: Constitutional, Gastrointestinal, Neurological, Skin, Genitourinary, Musculoskeletal, HENT, Endocrine, Cardiovascular, Respiratory, Allergic/Imm, Heme/Lymph   Last edited by Posey Boyer, COT on 02/17/2018  2:29 PM. (History)       ALLERGIES No Known Allergies  PAST MEDICAL HISTORY Past Medical History:  Diagnosis Date  . Allergy   . Anxiety   . OCD (obsessive compulsive disorder)   . Over weight    History reviewed. No pertinent surgical history.  FAMILY HISTORY Family History  Problem Relation Age of Onset  . Depression Mother   . Diabetes  Mother     SOCIAL HISTORY Social History   Tobacco Use  . Smoking status: Never Smoker  . Smokeless tobacco: Never Used  Substance Use Topics  . Alcohol use: No  . Drug use: No         OPHTHALMIC EXAM:  Base Eye Exam    Visual Acuity (Snellen - Linear)      Right Left   Dist cc 20/30 -2 20/20 -1   Dist ph cc 20/20 -1 NI   Correction:  Glasses       Tonometry (Tonopen, 2:37 PM)      Right Left   Pressure 11 14       Pupils      Dark Light Shape React APD   Right 6 4 Round Brisk None   Left 6 4 Round Brisk None       Visual Fields (Counting fingers)      Left Right    Full Full        Extraocular Movement      Right Left    Full, Ortho Full, Ortho       Neuro/Psych    Oriented x3:  Yes   Mood/Affect:  Normal       Dilation    Both eyes:  1.0% Mydriacyl, 2.5% Phenylephrine @ 2:37 PM        Slit Lamp and Fundus Exam    Slit Lamp Exam      Right Left   Lids/Lashes UL Telangiectasia, Meibomian gland dysfunction UL Telangiectasia, Meibomian gland dysfunction   Conjunctiva/Sclera White and quiet White and quiet   Cornea Clear Clear   Anterior Chamber Deep and quiet Deep and quiet   Iris Round and dilated Round and dilated   Lens Clear Clear   Vitreous Normal Normal       Fundus Exam      Right Left   Disc Tilted disc, Temporal Peripapillary atrophy Tilted disc, Peripapillary atrophy and pigmentaion   C/D Ratio 0.3 0.1   Macula Good foveal reflex Good foveal reflex   Vessels Normal Normal   Periphery Attached; temporal lattice degeneration with atrophic holes at 0700, 0800, 1000, 1100; no SRF, laser from 0500-0730, 0800, 0900-1030,  Lattice degeneration at 0130, pigmented Chorioretinal scar at 0400, pigmented inferior lattice, superior VR tufts at 1200, otherwise attached          IMAGING AND PROCEDURES  Imaging and Procedures for @TODAY @  Repair Retinal Breaks, Laser - OS - Left Eye       LASER PROCEDURE NOTE  Procedure:  Barrier laser retinopexy using slit lamp laser, LEFT eye   Diagnosis:   Lattice degeneration with atrophic retinal breaks, LEFT eye                     1200, 0130, and 0400 oclock, anterior to equator   Surgeon: Rennis Chris, MD, PhD  Anesthesia: Topical  Informed consent obtained, operative eye marked, and time out performed prior to initiation of laser.   Laser settings:  Lumenis Smart532 laser, slit lamp Lens: Mainster PRP 165 Power: 230 mW Spot size: 200 microns Duration: 30 msec  # spots: [id number]  Placement of laser: Using a Mainster PRP 165 contact lens at the slit lamp, laser was placed in three confluent rows  around each patch of lattice degeneration anterior to equator with additional rows anteriorly.  Complications: None.  Patient tolerated the procedure well and received written and verbal post-procedure care information/education.  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 Retinal edema H35.81 CANCELED: OCT, Retina - OU - Both Eyes  4. Myopia of both eyes H52.13     1,2. Lattice degeneration w/ atrophic holes, OU - patches of lattice w/ atrophic holes temporal periphery OU -- no SRF or RD - discussed findings, prognosis, and treatment options including observation - S/P laser retinopexy OD (09.13.19) -- good laser in place - recommend laser retinopexy OS today - pt wishes to proceed with laser - RBA of procedure discussed, questions answered - informed consent obtained and signed - see procedure note - start PF QID OS x7 days - f/u in 2-3 wks, sooner prn  3. No retinal edema on exam or OCT  4. High myopia OU - under the expert care of [facility] - discussed association of myopia with lattice degeneration and retinal breaks   Ophthalmic Meds Ordered this visit:  No orders of the defined types were placed in this encounter.      Return for 2-3 wks, POV.  There are no Patient Instructions on file for this visit.   Explained the diagnoses, plan, and follow up with the patient and they expressed understanding.  Patient expressed understanding of the importance of proper follow up care.   This document serves as a record of services personally performed by Karie Chimera, MD, PhD. It was created on their behalf by Laurian Brim, OA, an ophthalmic assistant. The creation of this record is the provider's dictation and/or activities during  the visit.    Electronically signed by: Laurian Brim, OA  09.26.19 11:36 AM    Karie Chimera, M.D., Ph.D. Diseases & Surgery of the Retina and Vitreous [facility]   I have reviewed the above documentation for accuracy and completeness, and I agree with the above. Karie Chimera, M.D., Ph.D. 02/20/18 11:36 AM   Abbreviations: M myopia (nearsighted); A astigmatism; H hyperopia (farsighted); P presbyopia; Mrx spectacle prescription;  CTL contact lenses; OD right eye; OS left eye; OU both eyes  XT exotropia; ET esotropia; PEK punctate epithelial keratitis; PEE punctate epithelial erosions; DES dry eye syndrome; MGD meibomian gland dysfunction; ATs artificial tears; PFAT's preservative free artificial tears; NSC nuclear sclerotic cataract; PSC posterior subcapsular cataract; ERM epi-retinal membrane; PVD posterior vitreous detachment; RD retinal detachment; DM diabetes mellitus; DR diabetic retinopathy; NPDR non-proliferative diabetic retinopathy; PDR proliferative diabetic retinopathy; CSME clinically significant macular edema; DME diabetic macular edema; dbh dot blot hemorrhages; CWS cotton wool spot; POAG primary open angle glaucoma; C/D cup-to-disc ratio; HVF humphrey visual field; GVF goldmann visual field; OCT optical coherence tomography; IOP intraocular pressure; BRVO Branch retinal vein occlusion; CRVO central retinal vein occlusion; CRAO central retinal artery occlusion; BRAO branch retinal artery occlusion; RT retinal tear; SB scleral buckle; PPV pars plana vitrectomy; VH Vitreous hemorrhage; PRP panretinal laser photocoagulation; IVK intravitreal kenalog; VMT vitreomacular traction; MH Macular hole;  NVD neovascularization of the disc; NVE neovascularization elsewhere; AREDS age related eye disease study; ARMD age related macular degeneration; POAG primary open angle glaucoma; EBMD epithelial/anterior basement membrane dystrophy; ACIOL anterior chamber intraocular  lens; IOL intraocular lens; PCIOL posterior chamber intraocular lens; Phaco/IOL phacoemulsification with intraocular lens placement; PRK photorefractive keratectomy; LASIK laser assisted  in situ keratomileusis; HTN hypertension; DM diabetes mellitus; COPD chronic obstructive pulmonary disease

## 2018-02-17 ENCOUNTER — Ambulatory Visit (INDEPENDENT_AMBULATORY_CARE_PROVIDER_SITE_OTHER): Payer: Medicaid Other | Admitting: Ophthalmology

## 2018-02-17 ENCOUNTER — Encounter (INDEPENDENT_AMBULATORY_CARE_PROVIDER_SITE_OTHER): Payer: Self-pay | Admitting: Ophthalmology

## 2018-02-17 DIAGNOSIS — H33323 Round hole, bilateral: Secondary | ICD-10-CM

## 2018-02-17 DIAGNOSIS — H35413 Lattice degeneration of retina, bilateral: Secondary | ICD-10-CM | POA: Diagnosis not present

## 2018-02-17 DIAGNOSIS — H5213 Myopia, bilateral: Secondary | ICD-10-CM

## 2018-02-17 DIAGNOSIS — H3581 Retinal edema: Secondary | ICD-10-CM

## 2018-02-20 ENCOUNTER — Encounter (INDEPENDENT_AMBULATORY_CARE_PROVIDER_SITE_OTHER): Payer: Self-pay | Admitting: Ophthalmology

## 2018-03-10 ENCOUNTER — Encounter (INDEPENDENT_AMBULATORY_CARE_PROVIDER_SITE_OTHER): Payer: Medicaid Other | Admitting: Ophthalmology

## 2018-03-14 NOTE — Progress Notes (Signed)
[facility] Clinic Note  03/15/2018     CHIEF COMPLAINT Patient presents for Post-op Follow-up   HISTORY OF PRESENT ILLNESS: Justin Barber is a 21 y.o. male who presents to the clinic today for:   HPI    Post-op Follow-up    In both eyes.  Vision is stable.  I, the attending physician,  performed the HPI with the patient and updated documentation appropriately.          Comments    22 y/o male pt returning for 3 wk f/u s/p laser retinopexy OU.  S/p laser ret OD on 09.13.19.  S/p laser ret OS on 09.27.19.  Patient states his vision is "about the same", denies new visual onsets.       Last edited by Bernarda Caffey, MD on 03/16/2018  1:40 PM. (History)    Pt reports he is doing well since last visit, and vision OU is good.  Has plans to get new glasses.  Referring physician: Shirleen Schirmer, PA-C [phone] [place], [place] [address] [phone]  HISTORICAL INFORMATION:   Selected notes from the MEDICAL RECORD NUMBER Referred by Shirleen Schirmer, PA-C for concern of retinal hole OU LEE: 08.28.19 (A. Lundquist) [BCVA: OD: 20/20 OS: 20/20-2 Ocular Hx-retinal hole OU, lattice degeneration OU PMH-anxiety, bipolar, OCD, ADHD    CURRENT MEDICATIONS: No current outpatient medications on file. (Ophthalmic Drugs)   No current facility-administered medications for this visit.  (Ophthalmic Drugs)   Current Outpatient Medications (Other)  Medication Sig  . buPROPion (WELLBUTRIN XL) 300 MG 24 hr tablet Take 300 mg by mouth every morning. Per Dr. Lovena Le  . cloNIDine (CATAPRES) 0.1 MG tablet Take 0.1 mg by mouth at bedtime. Per Dr. Lovena Le   . fluvoxaMINE (LUVOX) 100 MG tablet Take 200 mg by mouth at bedtime.   . folic acid (FOLVITE) 1 MG tablet Take 1 tablet (1 mg total) by mouth daily.  Marland Kitchen lisdexamfetamine (VYVANSE) 60 MG capsule Take 60 mg by mouth every morning.  . loratadine (CLARITIN) 10 MG tablet Take 1 tablet (10 mg total) by mouth daily.  .  Nutritional Supplements (MELATONIN PO) Unknown dose   . Psyllium (METAMUCIL) 30.9 % POWD Take by mouth as needed.    . Vitamin D, Cholecalciferol, 1000 UNITS TABS Take two tablets by mouth, daily.  Marland Kitchen VYVANSE 70 MG capsule TAKE ONE CAPSULE BY MOUTH EVERY MORNING FOR ADHD   No current facility-administered medications for this visit.  (Other)   Pt states he had no problems with laser, he used gtts for 7 days as directed   REVIEW OF SYSTEMS: ROS    Positive for: Eyes   Negative for: Constitutional, Gastrointestinal, Neurological, Skin, Genitourinary, Musculoskeletal, HENT, Endocrine, Cardiovascular, Respiratory, Psychiatric, Allergic/Imm, Heme/Lymph   Last edited by Matthew Folks, COA on 03/15/2018  2:59 PM. (History)       ALLERGIES No Known Allergies  PAST MEDICAL HISTORY Past Medical History:  Diagnosis Date  . Allergy   . Anxiety   . OCD (obsessive compulsive disorder)   . Over weight    History reviewed. No pertinent surgical history.  FAMILY HISTORY Family History  Problem Relation Age of Onset  . Depression Mother   . Diabetes Mother     SOCIAL HISTORY Social History   Tobacco Use  . Smoking status: Never Smoker  . Smokeless tobacco: Never Used  Substance Use Topics  . Alcohol use: No  . Drug use: No  OPHTHALMIC EXAM:  Base Eye Exam    Visual Acuity (Snellen - Linear)      Right Left   Dist cc 20/30 +1 20/20 -1   Dist ph cc 20/20 -1 20/20   Correction:  Glasses  Pt have diffculty reading with Rx glasses due to scratches/spots       Tonometry (Tonopen, 3:14 PM)      Right Left   Pressure 16 15       Pupils      Dark Light Shape React APD   Right 5 4 Round Brisk None   Left 5 4 Round Brisk None       Visual Fields (Counting fingers)      Left Right    Full Full       Extraocular Movement      Right Left    Full, Ortho Full, Ortho       Neuro/Psych    Oriented x3:  Yes   Mood/Affect:  Normal       Dilation    Both  eyes:  1.0% Mydriacyl, 2.5% Phenylephrine @ 3:14 PM        Slit Lamp and Fundus Exam    Slit Lamp Exam      Right Left   Lids/Lashes UL Telangiectasia, Meibomian gland dysfunction UL Telangiectasia, Meibomian gland dysfunction   Conjunctiva/Sclera White and quiet White and quiet   Cornea Clear Clear   Anterior Chamber Deep and quiet Deep and quiet   Iris Round and dilated Round and dilated   Lens Clear Clear   Vitreous Normal Normal       Fundus Exam      Right Left   Disc Tilted disc, Temporal Peripapillary atrophy Tilted disc, Peripapillary atrophy and pigmentaion   C/D Ratio 0.3 0.1   Macula Good foveal reflex Good foveal reflex   Vessels Normal Normal   Periphery Attached; temporal lattice degeneration with atrophic holes at 0700, 0800, 1000, 1100; no SRF; good laser in place from 5:30-10:30 around all lesions. Lattice degeneration at 0130, pigmented CR scar at 0400, pigmented inferior lattice, superior VR tufts at 1200, otherwise attached.  Good laser surrounding all lesions.          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 Retinal edema H35.81   4. Myopia of both eyes H52.13     1,2. Lattice degeneration w/ atrophic holes, OU - patches of lattice w/ atrophic holes temporal periphery OU -- no SRF or RD - S/P laser retinopexy OD (09.13.19) -- good laser in place - S/P laser retinopexy OS (09.27.19) -- good laser in place - Completed Pred Forte x 7 days - no new RT/RD on repeat peripheral exam - f/u 3 mos  3. No retinal edema on exam or OCT  4. High myopia OU - under the expert care of [facility] Ordered this visit:  No orders of the defined types were placed in this encounter.      Return in about 3 months (around 06/15/2018) for Dilated Exam, OCT.  There are no Patient  Instructions on file for this visit.   Explained the diagnoses, plan, and follow up with the patient and they expressed understanding.  Patient expressed understanding of the importance of proper follow up care.  This document serves as a record of services personally performed by Gardiner Sleeper, MD, PhD. It was created on their behalf by Ernest Mallick, OA, an ophthalmic assistant. The creation of this record is the provider's dictation and/or activities during the visit.    Electronically signed by: Ernest Mallick, OA  10.22.19 1:41 PM     Gardiner Sleeper, M.D., Ph.D. Diseases & Surgery of the Retina and Vitreous [facility]   I have reviewed the above documentation for accuracy and completeness, and I agree with the above. Gardiner Sleeper, M.D., Ph.D. 03/16/18 1:41 PM    Abbreviations: M myopia (nearsighted); A astigmatism; H hyperopia (farsighted); P presbyopia; Mrx spectacle prescription;  CTL contact lenses; OD right eye; OS left eye; OU both eyes  XT exotropia; ET esotropia; PEK punctate epithelial keratitis; PEE punctate epithelial erosions; DES dry eye syndrome; MGD meibomian gland dysfunction; ATs artificial tears; PFAT's preservative free artificial tears; [place] nuclear sclerotic cataract; PSC posterior subcapsular cataract; ERM epi-retinal membrane; PVD posterior vitreous detachment; RD retinal detachment; DM diabetes mellitus; DR diabetic retinopathy; NPDR non-proliferative diabetic retinopathy; PDR proliferative diabetic retinopathy; CSME clinically significant macular edema; DME diabetic macular edema; dbh dot blot hemorrhages; CWS cotton wool spot; POAG primary open angle glaucoma; C/D cup-to-disc ratio; HVF humphrey visual field; GVF goldmann visual field; OCT optical coherence tomography; IOP intraocular pressure; BRVO Branch retinal vein occlusion; CRVO central retinal vein occlusion; CRAO central retinal artery occlusion; BRAO branch retinal artery  occlusion; RT retinal tear; SB scleral buckle; PPV pars plana vitrectomy; VH Vitreous hemorrhage; PRP panretinal laser photocoagulation; IVK intravitreal kenalog; VMT vitreomacular traction; MH Macular hole;  NVD neovascularization of the disc; NVE neovascularization elsewhere; AREDS age related eye disease study; ARMD age related macular degeneration; POAG primary open angle glaucoma; EBMD epithelial/anterior basement membrane dystrophy; ACIOL anterior chamber intraocular lens; IOL intraocular lens; PCIOL posterior chamber intraocular lens; Phaco/IOL phacoemulsification with intraocular lens placement; [place] photorefractive keratectomy; LASIK laser assisted in situ keratomileusis; HTN hypertension; DM diabetes mellitus; COPD chronic obstructive pulmonary disease

## 2018-03-15 ENCOUNTER — Ambulatory Visit (INDEPENDENT_AMBULATORY_CARE_PROVIDER_SITE_OTHER): Payer: Medicaid Other | Admitting: Ophthalmology

## 2018-03-15 ENCOUNTER — Encounter (INDEPENDENT_AMBULATORY_CARE_PROVIDER_SITE_OTHER): Payer: Self-pay | Admitting: Ophthalmology

## 2018-03-15 DIAGNOSIS — H35413 Lattice degeneration of retina, bilateral: Secondary | ICD-10-CM

## 2018-03-15 DIAGNOSIS — H5213 Myopia, bilateral: Secondary | ICD-10-CM

## 2018-03-15 DIAGNOSIS — H3581 Retinal edema: Secondary | ICD-10-CM

## 2018-03-15 DIAGNOSIS — H33323 Round hole, bilateral: Secondary | ICD-10-CM

## 2018-06-16 ENCOUNTER — Encounter (INDEPENDENT_AMBULATORY_CARE_PROVIDER_SITE_OTHER): Payer: Medicaid Other | Admitting: Ophthalmology

## 2020-09-22 ENCOUNTER — Ambulatory Visit: Payer: No Typology Code available for payment source | Admitting: Family Medicine

## 2020-09-22 ENCOUNTER — Encounter: Payer: Self-pay | Admitting: Family Medicine

## 2020-09-22 ENCOUNTER — Other Ambulatory Visit: Payer: Self-pay

## 2020-09-22 VITALS — BP 120/70 | HR 53 | Ht 69.75 in | Wt 170.4 lb

## 2020-09-22 DIAGNOSIS — Z1329 Encounter for screening for other suspected endocrine disorder: Secondary | ICD-10-CM | POA: Diagnosis not present

## 2020-09-22 DIAGNOSIS — Z13220 Encounter for screening for lipoid disorders: Secondary | ICD-10-CM | POA: Diagnosis not present

## 2020-09-22 DIAGNOSIS — Z0000 Encounter for general adult medical examination without abnormal findings: Secondary | ICD-10-CM

## 2020-09-22 LAB — CBC WITH DIFFERENTIAL/PLATELET
Basos: 0 %
Eos: 2 %
Immature Granulocytes: 0 %
Platelets: 265 10*3/uL (ref 150–450)

## 2020-09-22 LAB — LIPID PANEL

## 2020-09-22 LAB — COMPREHENSIVE METABOLIC PANEL

## 2020-09-22 NOTE — Patient Instructions (Addendum)
I recommend that you check on getting the Gardasil vaccine to protect you against HPV  You can call to schedule a visit to get this if you decide that you want it.   It is recommended that you do self testicular exams monthly.   Call and schedule a dental exam.   We will be in touch with your lab results.      Preventive Care 19-24 Years Old, Male Preventive care refers to lifestyle choices and visits with your health care provider that can promote health and wellness. This includes:  A yearly physical exam. This is also called an annual wellness visit.  Regular dental and eye exams.  Immunizations.  Screening for certain conditions.  Healthy lifestyle choices, such as: ? Eating a healthy diet. ? Getting regular exercise. ? Not using drugs or products that contain nicotine and tobacco. ? Limiting alcohol use. What can I expect for my preventive care visit? Physical exam Your health care provider may check your:  Height and weight. These may be used to calculate your BMI (body mass index). BMI is a measurement that tells if you are at a healthy weight.  Heart rate and blood pressure.  Body temperature.  Skin for abnormal spots. Counseling Your health care provider may ask you questions about your:  Past medical problems.  Family's medical history.  Alcohol, tobacco, and drug use.  Emotional well-being.  Home life and relationship well-being.  Sexual activity.  Diet, exercise, and sleep habits.  Work and work Astronomer.  Access to firearms. What immunizations do I need? Vaccines are usually given at various ages, according to a schedule. Your health care provider will recommend vaccines for you based on your age, medical history, and lifestyle or other factors, such as travel or where you work.   What tests do I need? Blood tests  Lipid and cholesterol levels. These may be checked every 5 years starting at age 63.  Hepatitis C test.  Hepatitis B  test. Screening  Diabetes screening. This is done by checking your blood sugar (glucose) after you have not eaten for a while (fasting).  Genital exam to check for testicular cancer or hernias.  STD (sexually transmitted disease) testing, if you are at risk. Talk with your health care provider about your test results, treatment options, and if necessary, the need for more tests.   Follow these instructions at home: Eating and drinking  Eat a healthy diet that includes fresh fruits and vegetables, whole grains, lean protein, and low-fat dairy products.  Drink enough fluid to keep your urine pale yellow.  Take vitamin and mineral supplements as recommended by your health care provider.  Do not drink alcohol if your health care provider tells you not to drink.  If you drink alcohol: ? Limit how much you have to 0-2 drinks a day. ? Be aware of how much alcohol is in your drink. In the [place] one drink equals one 12 oz bottle of beer (355 mL), one 5 oz glass of wine (148 mL), or one 1 oz glass of hard liquor (44 mL).   Lifestyle  Take daily care of your teeth and gums. Brush your teeth every morning and night with fluoride toothpaste. Floss one time each day.  Stay active. Exercise for at least 30 minutes 5 or more days each week.  Do not use any products that contain nicotine or tobacco, such as cigarettes, e-cigarettes, and chewing tobacco. If you need help quitting, ask your health care provider.  Do not use drugs.  If you are sexually active, practice safe sex. Use a condom or other form of protection to prevent STIs (sexually transmitted infections).  Find healthy ways to cope with stress, such as: ? Meditation, yoga, or listening to music. ? Journaling. ? Talking to a trusted person. ? Spending time with friends and family. Safety  Always wear your seat belt while driving or riding in a vehicle.  Do not drive: ? If you have been drinking alcohol. Do not ride with someone  who has been drinking. ? When you are tired or distracted. ? While texting.  Wear a helmet and other protective equipment during sports activities.  If you have firearms in your house, make sure you follow all gun safety procedures.  Seek help if you have been physically or sexually abused. What's next?  Go to your health care provider once a year for an annual wellness visit.  Ask your health care provider how often you should have your eyes and teeth checked.  Stay up to date on all vaccines. This information is not intended to replace advice given to you by your health care provider. Make sure you discuss any questions you have with your health care provider. Document Revised: 01/24/2019 Document Reviewed: 05/04/2018 Elsevier Patient Education  2021 [employer].

## 2020-09-22 NOTE — Progress Notes (Signed)
Subjective:    Patient ID: Justin Barber, male    DOB: July 26, 1995, 25 y.o.   MRN: [id number]  HPI Chief Complaint  Patient presents with  . new pt    New pt cpe, no concerns   He is new to the practice and here for a complete physical exam.  Other providers:  Retinal specialist - Dr. Vanessa Barbara  Dr Groat's office   In the past he was under the care of a psychiatrist for bipolar 1 and ADHD. States he has not been on medication and is doing well mentally and physically.  No concerns today.    Social history: Lives with his grandparents and brother, works as a Lawyer at [employer]  Denies smoking, drinking alcohol, drug use Diet: fairly healthy  Exercise: nothing outside of a his very active job   Immunizations: UTD except for HPV and declines this today   Health maintenance:  Last Dental Exam: years ago  Last Eye Exam: 2019   Wears seatbelt always, uses sunscreen, smoke detectors in home and functioning, does not text while driving, feels safe in home environment.  Reviewed allergies, medications, past medical, surgical, family, and social history.   Review of Systems Review of Systems Constitutional: -fever, -chills, -sweats, -unexpected weight change,-fatigue ENT: -runny nose, -ear pain, -sore throat Cardiology:  -chest pain, -palpitations, -edema Respiratory: -cough, -shortness of breath, -wheezing Gastroenterology: -abdominal pain, -nausea, -vomiting, -diarrhea, -constipation  Hematology: -bleeding or bruising problems Musculoskeletal: -arthralgias, -myalgias, -joint swelling, -back pain Ophthalmology: -vision changes Urology: -dysuria, -difficulty urinating, -hematuria, -urinary frequency, -urgency Neurology: -headache, -weakness, -tingling, -numbness       Objective:   Physical Exam BP 120/70   Pulse (!) 53   Ht 5' 9.75" (1.772 m)   Wt 170 lb 6.4 oz (77.3 kg)   SpO2 98%   BMI 24.63 kg/m   General Appearance:    Alert, cooperative, no distress, appears  stated age  Head:    Normocephalic, without obvious abnormality, atraumatic  Eyes:    PERRL, conjunctiva/corneas clear, EOM's intact  Ears:    Normal TM's and external ear canals  Nose:   Mask on   Throat:   Mask on   Neck:   Supple, no lymphadenopathy;  thyroid:  no   enlargement/tenderness/nodules; no JVD  Back:    Spine nontender, no curvature, ROM normal, no CVA     tenderness  Lungs:     Clear to auscultation bilaterally without wheezes, rales or     ronchi; respirations unlabored  Chest Wall:    No tenderness or deformity   Heart:    Regular rate and rhythm, S1 and S2 normal, no murmur, rub   or gallop  Breast Exam:    No chest wall tenderness, masses or gynecomastia  Abdomen:     Soft, non-tender, nondistended, normoactive bowel sounds,    no masses, no hepatosplenomegaly  Genitalia:    Declines   Rectal:   Deferred due to age <40 and lack of symptoms  Extremities:   No clubbing, cyanosis or edema  Pulses:   2+ and symmetric all extremities  Skin:   Skin color, texture, turgor normal, no rashes or lesions  Lymph nodes:   Cervical, supraclavicular, and axillary nodes normal  Neurologic:   CNII-XII intact, normal strength, sensation and gait          Psych:   Normal mood, affect, hygiene and grooming.         Assessment & Plan:  Routine general  medical examination at a health care facility - Plan: CBC with Differential/Platelet, Comprehensive metabolic panel, TSH, T4, free, Lipid panel  Screening for thyroid disorder - Plan: TSH, T4, free  Screening for lipid disorders - Plan: Lipid panel  He is new to the practice and here today for fasting CPE.  His grandmother is with him. Reports being in his usual state of health.  He is not on any medications. Counseling on preventive health care and I recommend self testicular exams as well as calling and scheduling a dental exam since he is overdue.  Discussed healthy diet and exercise.  Immunizations reviewed.  He may call and  schedule an HPV vaccine if he would like.  Discussed safety health promotion.

## 2020-09-23 LAB — TSH: TSH: 1.71 u[IU]/mL (ref 0.450–4.500)

## 2020-09-23 LAB — CBC WITH DIFFERENTIAL/PLATELET
Basophils Absolute: 0 10*3/uL (ref 0.0–0.2)
EOS (ABSOLUTE): 0.1 10*3/uL (ref 0.0–0.4)
Hematocrit: 43.4 % (ref 37.5–51.0)
Hemoglobin: 14.8 g/dL (ref 13.0–17.7)
Immature Grans (Abs): 0 10*3/uL (ref 0.0–0.1)
Lymphocytes Absolute: 2.1 10*3/uL (ref 0.7–3.1)
Lymphs: 32 %
MCH: 29.4 pg (ref 26.6–33.0)
MCHC: 34.1 g/dL (ref 31.5–35.7)
MCV: 86 fL (ref 79–97)
Monocytes Absolute: 0.6 10*3/uL (ref 0.1–0.9)
Monocytes: 9 %
Neutrophils Absolute: 3.8 10*3/uL (ref 1.4–7.0)
Neutrophils: 57 %
RBC: 5.03 x10E6/uL (ref 4.14–5.80)
RDW: 11.9 % (ref 11.6–15.4)
WBC: 6.7 10*3/uL (ref 3.4–10.8)

## 2020-09-23 LAB — COMPREHENSIVE METABOLIC PANEL
ALT: 16 IU/L (ref 0–44)
AST: 20 IU/L (ref 0–40)
Albumin/Globulin Ratio: 2 (ref 1.2–2.2)
Albumin: 4.9 g/dL (ref 4.1–5.2)
Alkaline Phosphatase: 90 IU/L (ref 44–121)
BUN/Creatinine Ratio: 12 (ref 9–20)
BUN: 12 mg/dL (ref 6–20)
Bilirubin Total: 0.7 mg/dL (ref 0.0–1.2)
CO2: 25 mmol/L (ref 20–29)
Calcium: 9.8 mg/dL (ref 8.7–10.2)
Chloride: 103 mmol/L (ref 96–106)
Creatinine, Ser: 0.97 mg/dL (ref 0.76–1.27)
Glucose: 88 mg/dL (ref 65–99)
Potassium: 4.9 mmol/L (ref 3.5–5.2)
Sodium: 141 mmol/L (ref 134–144)
eGFR: 112 mL/min/{1.73_m2} (ref >59)

## 2020-09-23 LAB — LIPID PANEL
HDL: 53 mg/dL (ref >39)
Triglycerides: 29 mg/dL (ref 0–149)
VLDL Cholesterol Cal: 8 mg/dL (ref 5–40)

## 2020-09-23 LAB — T4, FREE: Free T4: 1.18 ng/dL (ref 0.82–1.77)

## 2022-04-05 ENCOUNTER — Other Ambulatory Visit (INDEPENDENT_AMBULATORY_CARE_PROVIDER_SITE_OTHER): Payer: No Typology Code available for payment source

## 2022-04-05 DIAGNOSIS — Z23 Encounter for immunization: Secondary | ICD-10-CM | POA: Diagnosis not present

## 2022-04-07 ENCOUNTER — Encounter: Payer: Self-pay | Admitting: Internal Medicine
